# Patient Record
Sex: Male | Born: 1955 | Hispanic: Yes | Marital: Married | Smoking: Never smoker
Health system: Southern US, Community
[De-identification: ages and names within clinical notes are randomized; demographics above are authoritative.]

## PROBLEM LIST (undated history)

## (undated) DIAGNOSIS — I10 Essential (primary) hypertension: Secondary | ICD-10-CM

## (undated) DIAGNOSIS — K5790 Diverticulosis of intestine, part unspecified, without perforation or abscess without bleeding: Secondary | ICD-10-CM

## (undated) DIAGNOSIS — E119 Type 2 diabetes mellitus without complications: Secondary | ICD-10-CM

## (undated) DIAGNOSIS — K219 Gastro-esophageal reflux disease without esophagitis: Secondary | ICD-10-CM

## (undated) DIAGNOSIS — E785 Hyperlipidemia, unspecified: Secondary | ICD-10-CM

## (undated) DIAGNOSIS — G4730 Sleep apnea, unspecified: Secondary | ICD-10-CM

## (undated) DIAGNOSIS — J349 Unspecified disorder of nose and nasal sinuses: Secondary | ICD-10-CM

---

## 2012-01-26 ENCOUNTER — Emergency Department: Payer: Self-pay | Admitting: Emergency Medicine

## 2012-01-26 LAB — COMPREHENSIVE METABOLIC PANEL
Albumin: 3.9 g/dL (ref 3.4–5.0)
Alkaline Phosphatase: 89 U/L (ref 50–136)
Anion Gap: 10 (ref 7–16)
BUN: 14 mg/dL (ref 7–18)
Bilirubin,Total: 0.3 mg/dL (ref 0.2–1.0)
Chloride: 103 mmol/L (ref 98–107)
Creatinine: 0.85 mg/dL (ref 0.60–1.30)
EGFR (African American): >60
Glucose: 102 mg/dL — ABNORMAL HIGH (ref 65–99)
Potassium: 3.8 mmol/L (ref 3.5–5.1)
SGOT(AST): 32 U/L (ref 15–37)
SGPT (ALT): 51 U/L
Total Protein: 8.3 g/dL — ABNORMAL HIGH (ref 6.4–8.2)

## 2012-01-26 LAB — CBC
HCT: 43.9 % (ref 40.0–52.0)
MCH: 30.7 pg (ref 26.0–34.0)
MCHC: 33.7 g/dL (ref 32.0–36.0)
MCV: 91 fL (ref 80–100)
Platelet: 197 10*3/uL (ref 150–440)
RBC: 4.83 10*6/uL (ref 4.40–5.90)
RDW: 13.2 % (ref 11.5–14.5)

## 2012-01-26 LAB — CK TOTAL AND CKMB (NOT AT ARMC)
CK, Total: 152 U/L (ref 35–232)
CK-MB: 1.1 ng/mL (ref 0.5–3.6)

## 2012-01-30 ENCOUNTER — Emergency Department: Payer: Self-pay | Admitting: Unknown Physician Specialty

## 2012-01-31 LAB — TROPONIN I: Troponin-I: <0.02 ng/mL

## 2014-07-02 ENCOUNTER — Emergency Department: Payer: Self-pay | Admitting: Emergency Medicine

## 2016-01-02 ENCOUNTER — Emergency Department
Admission: EM | Admit: 2016-01-02 | Discharge: 2016-01-02 | Disposition: A | Discharge status: Home / Self Care | Payer: BLUE CROSS/BLUE SHIELD | Attending: Emergency Medicine | Admitting: Emergency Medicine

## 2016-01-02 ENCOUNTER — Encounter: Payer: Self-pay | Admitting: Emergency Medicine

## 2016-01-02 ENCOUNTER — Emergency Department: Payer: BLUE CROSS/BLUE SHIELD

## 2016-01-02 DIAGNOSIS — Y998 Other external cause status: Secondary | ICD-10-CM | POA: Diagnosis not present

## 2016-01-02 DIAGNOSIS — I10 Essential (primary) hypertension: Secondary | ICD-10-CM | POA: Diagnosis not present

## 2016-01-02 DIAGNOSIS — X503XXA Overexertion from repetitive movements, initial encounter: Secondary | ICD-10-CM | POA: Diagnosis not present

## 2016-01-02 DIAGNOSIS — S4991XA Unspecified injury of right shoulder and upper arm, initial encounter: Secondary | ICD-10-CM | POA: Diagnosis present

## 2016-01-02 DIAGNOSIS — Y9289 Other specified places as the place of occurrence of the external cause: Secondary | ICD-10-CM | POA: Insufficient documentation

## 2016-01-02 DIAGNOSIS — Y9389 Activity, other specified: Secondary | ICD-10-CM | POA: Insufficient documentation

## 2016-01-02 DIAGNOSIS — M7080 Other soft tissue disorders related to use, overuse and pressure of unspecified site: Secondary | ICD-10-CM

## 2016-01-02 DIAGNOSIS — G5601 Carpal tunnel syndrome, right upper limb: Secondary | ICD-10-CM | POA: Diagnosis not present

## 2016-01-02 DIAGNOSIS — T148XXA Other injury of unspecified body region, initial encounter: Secondary | ICD-10-CM

## 2016-01-02 MED ORDER — METHYLPREDNISOLONE 4 MG PO TBPK: ORAL_TABLET | ORAL | Status: DC

## 2016-01-02 MED ORDER — TRAMADOL HCL 50 MG PO TABS: 50.0000 mg | ORAL_TABLET | Freq: Four times a day (QID) | ORAL | Status: DC | PRN

## 2016-01-02 MED ORDER — DEXAMETHASONE SODIUM PHOSPHATE 10 MG/ML IJ SOLN
10.0000 mg | Freq: Once | INTRAMUSCULAR | Status: AC
  Administered 2016-01-02: 10 mg via INTRAMUSCULAR
  Filled 2016-01-02: qty 1

## 2016-01-02 MED ORDER — TRAMADOL HCL 50 MG PO TABS
50.0000 mg | ORAL_TABLET | Freq: Once | ORAL | Status: AC
  Administered 2016-01-02: 50 mg via ORAL
  Filled 2016-01-02: qty 1

## 2016-01-02 NOTE — ED Notes (Signed)
Pt to ed with c/o right arm and hand pain x 2 months.

## 2016-01-02 NOTE — ED Provider Notes (Signed)
[facility] Emergency Department Provider Note  ____________________________________________  Time seen: Approximately 12:09 PM  I have reviewed the triage vital signs and the nursing notes.   HISTORY  Chief Complaint Arm Pain  Via interpreter  HPI Ryan Hull is a 60 y.o. male patient complain right upper extremity pain for 2 months. Onset of pain was in the right hand and wrist. Patient stated he started experiencing pain with intermittent numbness to the right hand. Patient went to family doctor and was told he had carpal tunnel. Patient was told to take ibuprofen twice a day. Patient state there was some mild relief but started developing stomach pain from taking ibuprofen. He believes his history of diverticulitis take ibuprofen regularly was causing increased stomach pain. Patient stated after discontinuing ibuprofen for 1 week he started noticing pain in the right shoulder and increasing numbness from the shoulder to the hands. Patient has a job history repetitive use of the right upper extremity. Patient is rating his pain as a 7/10.   History reviewed. No pertinent past medical history.  There are no active problems to display for this patient.   History reviewed. No pertinent past surgical history.  No current outpatient prescriptions on file.  Allergies Enalapril  No family history on file.  Social History Social History  Substance Use Topics  . Smoking status: Never Smoker   . Smokeless tobacco: None  . Alcohol Use: Yes    Review of Systems Constitutional: No fever/chills Eyes: No visual changes. ENT: No sore throat. Cardiovascular: Denies chest pain. Respiratory: Denies shortness of breath. Gastrointestinal: No abdominal pain.  No nausea, no vomiting.  No diarrhea.  No constipation. Genitourinary: Negative for dysuria. Musculoskeletal right upper extremity pain. Skin: Negative for rash. Neurological: Negative for headaches, focal  weakness or numbness. Numbness right upper extremity Endocrine:Hypertension 10-point ROS otherwise negative.  ____________________________________________   PHYSICAL EXAM:  VITAL SIGNS: ED Triage Vitals  Enc Vitals Group     BP 01/02/16 1137 154/90 mmHg     Pulse Rate 01/02/16 1137 81     Resp 01/02/16 1137 20     Temp 01/02/16 1137 98.5 F (36.9 C)     Temp Source 01/02/16 1137 Oral     SpO2 01/02/16 1137 99 %     Weight 01/02/16 1140 188 lb (85.276 kg)     Height 01/02/16 1140 5\' 6"  (1.676 m)     Head Cir --      Peak Flow --      Pain Score 01/02/16 1141 7     Pain Loc --      Pain Edu? --      Excl. in GC? --     Constitutional: Alert and oriented. Well appearing and in no acute distress. Eyes: Conjunctivae are normal. PERRL. EOMI. Head: Atraumatic. Nose: No congestion/rhinnorhea. Mouth/Throat: Mucous membranes are moist.  Oropharynx non-erythematous. Neck: No stridor.  No cervical spine tenderness to palpation. Hematological/Lymphatic/Immunilogical: No cervical lymphadenopathy. Cardiovascular: Normal rate, regular rhythm. Grossly normal heart sounds.  Good peripheral circulation. Respiratory: Normal respiratory effort.  No retractions. Lungs CTAB. Gastrointestinal: Soft and nontender. No distention. No abdominal bruits. No CVA tenderness. Musculoskeletal: No obvious deformities to the right upper extremity. Full and equal range of motion. Neurologic:  Normal speech and language. No gross focal neurologic deficits are appreciated. No gait instability. Positive Tinel's sign  Skin:  Skin is warm, dry and intact. No rash noted. Psychiatric: Mood and affect are normal. Speech and behavior are normal.  ____________________________________________   LABS (all labs ordered are listed, but only abnormal results are displayed)  Labs Reviewed - No data to  display ____________________________________________  EKG   ____________________________________________  RADIOLOGY  No acute findings on x-ray. ____________________________________________   PROCEDURES  Procedure(s) performed: None  Critical Care performed: No  ____________________________________________   INITIAL IMPRESSION / ASSESSMENT AND PLAN / ED COURSE  Pertinent labs & imaging results that were available during my care of the patient were reviewed by me and considered in my medical decision making (see chart for details).  Repetitive motion injury to right shoulder. Carpal tunnel syndrome right wrist. Patient given discharge instructions and advised to follow-up with orthopedic clinic for further evaluation and treatment. Patient given a prescription for Medrol Dosepak and tramadol. ____________________________________________   FINAL CLINICAL IMPRESSION(S) / ED DIAGNOSES  Final diagnoses:  Repetitive motion injury  Carpal tunnel syndrome of right wrist      Joni Reining, PA-C 01/02/16 1345  Joni Reining, PA-C 01/02/16 1350  Jeanmarie Plant, MD 01/02/16 1409

## 2016-01-02 NOTE — ED Notes (Signed)
Right arm and hand pain for about 2 months   Unsure of injury  No deformity noted

## 2016-02-07 ENCOUNTER — Other Ambulatory Visit: Payer: Self-pay | Admitting: Student

## 2016-02-07 DIAGNOSIS — M4726 Other spondylosis with radiculopathy, lumbar region: Secondary | ICD-10-CM

## 2016-02-08 ENCOUNTER — Encounter
Admission: RE | Admit: 2016-02-08 | Discharge: 2016-02-08 | Disposition: A | Discharge status: Home / Self Care | Payer: BLUE CROSS/BLUE SHIELD | Source: Ambulatory Visit | Attending: Surgery | Admitting: Surgery

## 2016-02-08 DIAGNOSIS — Z01810 Encounter for preprocedural cardiovascular examination: Secondary | ICD-10-CM | POA: Insufficient documentation

## 2016-02-08 DIAGNOSIS — I10 Essential (primary) hypertension: Secondary | ICD-10-CM | POA: Diagnosis not present

## 2016-02-08 HISTORY — DX: Essential (primary) hypertension: I10

## 2016-02-08 HISTORY — DX: Gastro-esophageal reflux disease without esophagitis: K21.9

## 2016-02-08 HISTORY — DX: Diverticulosis of intestine, part unspecified, without perforation or abscess without bleeding: K57.90

## 2016-02-08 HISTORY — DX: Hyperlipidemia, unspecified: E78.5

## 2016-02-08 HISTORY — DX: Unspecified disorder of nose and nasal sinuses: J34.9

## 2016-02-08 HISTORY — DX: Sleep apnea, unspecified: G47.30

## 2016-02-08 HISTORY — DX: Type 2 diabetes mellitus without complications: E11.9

## 2016-02-08 NOTE — Pre-Procedure Instructions (Signed)
Spanish interpreter, Duwayne Heck, present.

## 2016-02-08 NOTE — Patient Instructions (Addendum)
  Your procedure is scheduled on: 02/13/16 Tues  Report to Day Surgery.2nd floor medical mall To find out your arrival time please call [phone] between 1PM - 3PM on 02/12/16.  Remember: Instructions that are not followed completely may result in serious medical risk, up to and including death, or upon the discretion of your surgeon and anesthesiologist your surgery may need to be rescheduled.    _x__ 1. Do not eat food or drink liquids after midnight. No gum chewing or hard candies.     ____ 2. No Alcohol for 24 hours before or after surgery.   ____ 3. Bring all medications with you on the day of surgery if instructed.    x____ 4. Notify your doctor if there is any change in your medical condition     (cold, fever, infections).     Do not wear jewelry, make-up, hairpins, clips or nail polish.  Do not wear lotions, powders, or perfumes. You may wear deodorant.  Do not shave 48 hours prior to surgery. Men may shave face and neck.  Do not bring valuables to the hospital.    [facility] is not responsible for any belongings or valuables.               Contacts, dentures or bridgework may not be worn into surgery.  Leave your suitcase in the car. After surgery it may be brought to your room.  For patients admitted to the hospital, discharge time is determined by your                treatment team.   Patients discharged the day of surgery will not be allowed to drive home.   Please read over the following fact sheets that you were given:      __x__ Take these medicines the morning of surgery with A SIP OF WATER:    1. prilosec  2. chlorthalidone  3.   4.  5.  6.  ____ Fleet Enema (as directed)   __x__ Use CHG Soap as directed  ____ Use inhalers on the day of surgery  ____ Stop metformin 2 days prior to surgery    ____ Take 1/2 of usual insulin dose the night before surgery and none on the morning of surgery.   __x__ Stop Coumadin/Plavix/aspirin on stop aspirin  now  _x_ Stop Anti-inflammatories today stop ibuprofen and aspirin may take tylenol as needed   ____ Stop supplements until after surgery.    ____ Bring C-Pap to the hospital.

## 2016-02-13 ENCOUNTER — Encounter
Admission: RE | Disposition: A | Discharge status: Home / Self Care | Payer: Self-pay | Source: Ambulatory Visit | Attending: Surgery

## 2016-02-13 ENCOUNTER — Encounter: Payer: Self-pay | Admitting: *Deleted

## 2016-02-13 ENCOUNTER — Ambulatory Visit
Admission: RE | Admit: 2016-02-13 | Discharge: 2016-02-13 | Disposition: A | Discharge status: Home / Self Care | Payer: BLUE CROSS/BLUE SHIELD | Source: Ambulatory Visit | Attending: Surgery | Admitting: Surgery

## 2016-02-13 DIAGNOSIS — Z5309 Procedure and treatment not carried out because of other contraindication: Secondary | ICD-10-CM | POA: Diagnosis not present

## 2016-02-13 DIAGNOSIS — G5601 Carpal tunnel syndrome, right upper limb: Secondary | ICD-10-CM | POA: Diagnosis present

## 2016-02-13 LAB — GLUCOSE, CAPILLARY: Glucose-Capillary: 136 mg/dL — ABNORMAL HIGH (ref 65–99)

## 2016-02-13 SURGERY — RELEASE, CARPAL TUNNEL, ENDOSCOPIC
Anesthesia: Choice | Laterality: Right

## 2016-02-13 MED ORDER — SODIUM CHLORIDE 0.9 % IV SOLN: INTRAVENOUS | Status: DC

## 2016-02-13 MED ORDER — BUPIVACAINE-EPINEPHRINE (PF) 0.5% -1:200000 IJ SOLN
INTRAMUSCULAR | Status: AC
  Filled 2016-02-13: qty 30

## 2016-02-13 MED ORDER — CEFAZOLIN SODIUM-DEXTROSE 2-3 GM-% IV SOLR
INTRAVENOUS | Status: AC
  Filled 2016-02-13: qty 50

## 2016-02-13 MED ORDER — CEFAZOLIN SODIUM-DEXTROSE 2-3 GM-% IV SOLR: 2.0000 g | Freq: Once | INTRAVENOUS | Status: DC

## 2016-02-13 SURGICAL SUPPLY — 38 items
BLADE SURG 15 STRL LF DISP TIS (BLADE) IMPLANT
BLADE SURG 15 STRL SS (BLADE)
BNDG COHESIVE 4X5 TAN STRL (GAUZE/BANDAGES/DRESSINGS) IMPLANT
BNDG ELASTIC 2X5.8 VLCR STR LF (GAUZE/BANDAGES/DRESSINGS) IMPLANT
BNDG ESMARK 4X12 TAN STRL LF (GAUZE/BANDAGES/DRESSINGS) IMPLANT
BNDG GAUZE 4.5X4.1 6PLY STRL (MISCELLANEOUS) IMPLANT
CANISTER SUCT 1200ML W/VALVE (MISCELLANEOUS) IMPLANT
CHLORAPREP W/TINT 26ML (MISCELLANEOUS) IMPLANT
CORD BIP STRL DISP 12FT (MISCELLANEOUS) IMPLANT
DRAPE IMP U-DRAPE 54X76 (DRAPES) IMPLANT
DRAPE SURG 17X11 SM STRL (DRAPES) IMPLANT
GAUZE PETRO XEROFOAM 1X8 (MISCELLANEOUS) IMPLANT
GAUZE SPONGE 4X4 12PLY STRL (GAUZE/BANDAGES/DRESSINGS) IMPLANT
GLOVE BIO SURGEON STRL SZ8 (GLOVE) IMPLANT
GLOVE BIOGEL M 7.0 STRL (GLOVE) IMPLANT
GLOVE BIOGEL PI IND STRL 7.5 (GLOVE) IMPLANT
GLOVE BIOGEL PI INDICATOR 7.5 (GLOVE)
GLOVE INDICATOR 8.0 STRL GRN (GLOVE) IMPLANT
GOWN STRL REUS W/ TWL LRG LVL3 (GOWN DISPOSABLE) IMPLANT
GOWN STRL REUS W/ TWL XL LVL3 (GOWN DISPOSABLE) IMPLANT
GOWN STRL REUS W/TWL LRG LVL3 (GOWN DISPOSABLE)
GOWN STRL REUS W/TWL XL LVL3 (GOWN DISPOSABLE)
KIT CARPAL TUNNEL (MISCELLANEOUS)
KIT ESCP INSRT D SLOT CANN KN (MISCELLANEOUS) IMPLANT
KIT RM TURNOVER STRD PROC AR (KITS) IMPLANT
NDL SAFETY 25GX1.5 (NEEDLE) IMPLANT
NS IRRIG 500ML POUR BTL (IV SOLUTION) IMPLANT
PACK EXTREMITY ARMC (MISCELLANEOUS) IMPLANT
PAD CAST CTTN 4X4 STRL (SOFTGOODS) IMPLANT
PADDING CAST COTTON 4X4 STRL (SOFTGOODS)
SPLINT WRIST LG LT TX990309 (SOFTGOODS) IMPLANT
SPLINT WRIST M LT TX990308 (SOFTGOODS) IMPLANT
SPLINT WRIST M RT TX990303 (SOFTGOODS) IMPLANT
STOCKINETTE M/LG 89821 (MISCELLANEOUS) IMPLANT
STOCKINETTE STRL 4IN 9604848 (GAUZE/BANDAGES/DRESSINGS) IMPLANT
SUT PROLENE 4 0 PS 2 18 (SUTURE) IMPLANT
SYR 30ML LL (SYRINGE) IMPLANT
SYRINGE 10CC LL (SYRINGE) IMPLANT

## 2016-02-13 NOTE — Progress Notes (Signed)
Patient arrived to [facility], patient stated he had a beer this morning, ate breakfast and lunch. Anesthesia cancelled surgery. Made patient aware that he needs to be NPO after midnight prior when his surgery is rescheduled and advised to call office in the next couple of days to reschedule if office does not contact prior. Patient left ambulatory.

## 2016-02-20 ENCOUNTER — Encounter: Payer: Self-pay | Admitting: Anesthesiology

## 2016-02-20 ENCOUNTER — Ambulatory Visit
Admission: RE | Admit: 2016-02-20 | Discharge: 2016-02-20 | Disposition: A | Discharge status: Home / Self Care | Payer: BLUE CROSS/BLUE SHIELD | Source: Ambulatory Visit | Attending: Surgery | Admitting: Surgery

## 2016-02-20 ENCOUNTER — Ambulatory Visit: Payer: BLUE CROSS/BLUE SHIELD | Admitting: Anesthesiology

## 2016-02-20 ENCOUNTER — Encounter
Admission: RE | Disposition: A | Discharge status: Home / Self Care | Payer: Self-pay | Source: Ambulatory Visit | Attending: Surgery

## 2016-02-20 DIAGNOSIS — Z79899 Other long term (current) drug therapy: Secondary | ICD-10-CM | POA: Insufficient documentation

## 2016-02-20 DIAGNOSIS — I10 Essential (primary) hypertension: Secondary | ICD-10-CM | POA: Diagnosis not present

## 2016-02-20 DIAGNOSIS — M5416 Radiculopathy, lumbar region: Secondary | ICD-10-CM | POA: Insufficient documentation

## 2016-02-20 DIAGNOSIS — G4730 Sleep apnea, unspecified: Secondary | ICD-10-CM | POA: Insufficient documentation

## 2016-02-20 DIAGNOSIS — Z888 Allergy status to other drugs, medicaments and biological substances status: Secondary | ICD-10-CM | POA: Insufficient documentation

## 2016-02-20 DIAGNOSIS — E785 Hyperlipidemia, unspecified: Secondary | ICD-10-CM | POA: Insufficient documentation

## 2016-02-20 DIAGNOSIS — Z7982 Long term (current) use of aspirin: Secondary | ICD-10-CM | POA: Diagnosis not present

## 2016-02-20 DIAGNOSIS — Z7951 Long term (current) use of inhaled steroids: Secondary | ICD-10-CM | POA: Insufficient documentation

## 2016-02-20 DIAGNOSIS — Z8261 Family history of arthritis: Secondary | ICD-10-CM | POA: Diagnosis not present

## 2016-02-20 DIAGNOSIS — M479 Spondylosis, unspecified: Secondary | ICD-10-CM | POA: Diagnosis not present

## 2016-02-20 DIAGNOSIS — Z8349 Family history of other endocrine, nutritional and metabolic diseases: Secondary | ICD-10-CM | POA: Diagnosis not present

## 2016-02-20 DIAGNOSIS — G5601 Carpal tunnel syndrome, right upper limb: Secondary | ICD-10-CM | POA: Diagnosis not present

## 2016-02-20 DIAGNOSIS — Z833 Family history of diabetes mellitus: Secondary | ICD-10-CM | POA: Diagnosis not present

## 2016-02-20 DIAGNOSIS — Z8249 Family history of ischemic heart disease and other diseases of the circulatory system: Secondary | ICD-10-CM | POA: Insufficient documentation

## 2016-02-20 DIAGNOSIS — Z8379 Family history of other diseases of the digestive system: Secondary | ICD-10-CM | POA: Insufficient documentation

## 2016-02-20 HISTORY — PX: CARPAL TUNNEL RELEASE: SHX101

## 2016-02-20 LAB — GLUCOSE, CAPILLARY
Glucose-Capillary: 106 mg/dL — ABNORMAL HIGH (ref 65–99)
Glucose-Capillary: 127 mg/dL — ABNORMAL HIGH (ref 65–99)

## 2016-02-20 SURGERY — RELEASE, CARPAL TUNNEL, ENDOSCOPIC
Anesthesia: General | Site: Hand | Laterality: Right | Wound class: Clean

## 2016-02-20 MED ORDER — PROPOFOL 10 MG/ML IV BOLUS
INTRAVENOUS | Status: DC | PRN
  Administered 2016-02-20: 170 mg via INTRAVENOUS

## 2016-02-20 MED ORDER — SODIUM CHLORIDE 0.9 % IV SOLN
INTRAVENOUS | Status: DC
  Administered 2016-02-20: 07:00:00 via INTRAVENOUS

## 2016-02-20 MED ORDER — CEFAZOLIN SODIUM-DEXTROSE 2-3 GM-% IV SOLR
INTRAVENOUS | Status: AC
  Administered 2016-02-20: 2 g via INTRAVENOUS
  Filled 2016-02-20: qty 50

## 2016-02-20 MED ORDER — NEOSTIGMINE METHYLSULFATE 10 MG/10ML IV SOLN
INTRAVENOUS | Status: DC | PRN
  Administered 2016-02-20: 4 mg via INTRAVENOUS

## 2016-02-20 MED ORDER — FAMOTIDINE 20 MG PO TABS
ORAL_TABLET | ORAL | Status: AC
  Filled 2016-02-20: qty 1

## 2016-02-20 MED ORDER — BUPIVACAINE-EPINEPHRINE (PF) 0.5% -1:200000 IJ SOLN
INTRAMUSCULAR | Status: DC | PRN
  Administered 2016-02-20: 10 mL via PERINEURAL

## 2016-02-20 MED ORDER — METOCLOPRAMIDE HCL 5 MG/ML IJ SOLN
5.0000 mg | Freq: Three times a day (TID) | INTRAMUSCULAR | Status: DC | PRN
Start: 2016-02-20 — End: 2016-02-20

## 2016-02-20 MED ORDER — ONDANSETRON HCL 4 MG/2ML IJ SOLN: 4.0000 mg | Freq: Once | INTRAMUSCULAR | Status: DC | PRN

## 2016-02-20 MED ORDER — ONDANSETRON HCL 4 MG/2ML IJ SOLN: 4.0000 mg | Freq: Four times a day (QID) | INTRAMUSCULAR | Status: DC | PRN

## 2016-02-20 MED ORDER — FENTANYL CITRATE (PF) 100 MCG/2ML IJ SOLN: 25.0000 ug | INTRAMUSCULAR | Status: DC | PRN

## 2016-02-20 MED ORDER — BUPIVACAINE HCL (PF) 0.5 % IJ SOLN
INTRAMUSCULAR | Status: AC
  Filled 2016-02-20: qty 30

## 2016-02-20 MED ORDER — FENTANYL CITRATE (PF) 100 MCG/2ML IJ SOLN
INTRAMUSCULAR | Status: DC | PRN
  Administered 2016-02-20 (×2): 50 ug via INTRAVENOUS

## 2016-02-20 MED ORDER — SUCCINYLCHOLINE CHLORIDE 20 MG/ML IJ SOLN
INTRAMUSCULAR | Status: DC | PRN
  Administered 2016-02-20: 100 mg via INTRAVENOUS

## 2016-02-20 MED ORDER — HYDROCODONE-ACETAMINOPHEN 5-325 MG PO TABS: 1.0000 | ORAL_TABLET | ORAL | Status: DC | PRN

## 2016-02-20 MED ORDER — GLYCOPYRROLATE 0.2 MG/ML IJ SOLN
INTRAMUSCULAR | Status: DC | PRN
  Administered 2016-02-20: 0.6 mg via INTRAVENOUS

## 2016-02-20 MED ORDER — LIDOCAINE HCL (CARDIAC) 20 MG/ML IV SOLN
INTRAVENOUS | Status: DC | PRN
  Administered 2016-02-20: 100 mg via INTRAVENOUS

## 2016-02-20 MED ORDER — ROCURONIUM BROMIDE 100 MG/10ML IV SOLN
INTRAVENOUS | Status: DC | PRN
  Administered 2016-02-20: 5 mg via INTRAVENOUS
  Administered 2016-02-20: 10 mg via INTRAVENOUS

## 2016-02-20 MED ORDER — ONDANSETRON HCL 4 MG/2ML IJ SOLN
INTRAMUSCULAR | Status: DC | PRN
  Administered 2016-02-20: 4 mg via INTRAVENOUS

## 2016-02-20 MED ORDER — MIDAZOLAM HCL 2 MG/2ML IJ SOLN
INTRAMUSCULAR | Status: DC | PRN
  Administered 2016-02-20: 2 mg via INTRAVENOUS

## 2016-02-20 MED ORDER — METOCLOPRAMIDE HCL 10 MG PO TABS
5.0000 mg | ORAL_TABLET | Freq: Three times a day (TID) | ORAL | Status: DC | PRN
Start: 2016-02-20 — End: 2016-02-20

## 2016-02-20 MED ORDER — FAMOTIDINE 20 MG PO TABS
20.0000 mg | ORAL_TABLET | Freq: Once | ORAL | Status: AC
  Administered 2016-02-20: 20 mg via ORAL

## 2016-02-20 MED ORDER — DEXAMETHASONE SODIUM PHOSPHATE 10 MG/ML IJ SOLN
INTRAMUSCULAR | Status: DC | PRN
  Administered 2016-02-20: 5 mg via INTRAVENOUS

## 2016-02-20 MED ORDER — ONDANSETRON HCL 4 MG PO TABS: 4.0000 mg | ORAL_TABLET | Freq: Four times a day (QID) | ORAL | Status: DC | PRN

## 2016-02-20 MED ORDER — HYDROCODONE-ACETAMINOPHEN 5-325 MG PO TABS: 1.0000 | ORAL_TABLET | Freq: Four times a day (QID) | ORAL | Status: AC | PRN

## 2016-02-20 MED ORDER — POTASSIUM CHLORIDE IN NACL 20-0.9 MEQ/L-% IV SOLN: INTRAVENOUS | Status: DC

## 2016-02-20 MED ORDER — LACTATED RINGERS IV SOLN
INTRAVENOUS | Status: DC | PRN
  Administered 2016-02-20: 07:00:00 via INTRAVENOUS

## 2016-02-20 MED ORDER — CEFAZOLIN SODIUM-DEXTROSE 2-3 GM-% IV SOLR: 2.0000 g | Freq: Once | INTRAVENOUS | Status: DC

## 2016-02-20 SURGICAL SUPPLY — 39 items
BLADE SURG 15 STRL LF DISP TIS (BLADE) ×1 IMPLANT
BLADE SURG 15 STRL SS (BLADE) ×2
BNDG COHESIVE 4X5 TAN STRL (GAUZE/BANDAGES/DRESSINGS) ×3 IMPLANT
BNDG ELASTIC 2X5.8 VLCR STR LF (GAUZE/BANDAGES/DRESSINGS) ×3 IMPLANT
BNDG ESMARK 4X12 TAN STRL LF (GAUZE/BANDAGES/DRESSINGS) ×3 IMPLANT
BNDG GAUZE 4.5X4.1 6PLY STRL (MISCELLANEOUS) IMPLANT
CANISTER SUCT 1200ML W/VALVE (MISCELLANEOUS) ×3 IMPLANT
CHLORAPREP W/TINT 26ML (MISCELLANEOUS) ×3 IMPLANT
CORD BIP STRL DISP 12FT (MISCELLANEOUS) ×3 IMPLANT
DRAPE IMP U-DRAPE 54X76 (DRAPES) ×3 IMPLANT
DRAPE SURG 17X11 SM STRL (DRAPES) ×3 IMPLANT
GAUZE PETRO XEROFOAM 1X8 (MISCELLANEOUS) ×3 IMPLANT
GAUZE SPONGE 4X4 12PLY STRL (GAUZE/BANDAGES/DRESSINGS) ×3 IMPLANT
GLOVE BIO SURGEON STRL SZ8 (GLOVE) ×6 IMPLANT
GLOVE BIOGEL M 7.0 STRL (GLOVE) ×6 IMPLANT
GLOVE BIOGEL PI IND STRL 7.5 (GLOVE) ×1 IMPLANT
GLOVE BIOGEL PI INDICATOR 7.5 (GLOVE) ×2
GLOVE INDICATOR 8.0 STRL GRN (GLOVE) ×3 IMPLANT
GOWN STRL REUS W/ TWL LRG LVL3 (GOWN DISPOSABLE) ×1 IMPLANT
GOWN STRL REUS W/ TWL XL LVL3 (GOWN DISPOSABLE) ×2 IMPLANT
GOWN STRL REUS W/TWL LRG LVL3 (GOWN DISPOSABLE) ×2
GOWN STRL REUS W/TWL XL LVL3 (GOWN DISPOSABLE) ×4
KIT CARPAL TUNNEL (MISCELLANEOUS) ×2
KIT ESCP INSRT D SLOT CANN KN (MISCELLANEOUS) ×1 IMPLANT
KIT RM TURNOVER STRD PROC AR (KITS) ×3 IMPLANT
NDL SAFETY 25GX1.5 (NEEDLE) ×3 IMPLANT
NS IRRIG 500ML POUR BTL (IV SOLUTION) ×3 IMPLANT
PACK EXTREMITY ARMC (MISCELLANEOUS) ×3 IMPLANT
PAD CAST CTTN 4X4 STRL (SOFTGOODS) IMPLANT
PADDING CAST COTTON 4X4 STRL (SOFTGOODS)
SPLINT WRIST LG LT TX990309 (SOFTGOODS) IMPLANT
SPLINT WRIST LG RT TX900304 (SOFTGOODS) ×3 IMPLANT
SPLINT WRIST M LT TX990308 (SOFTGOODS) IMPLANT
SPLINT WRIST M RT TX990303 (SOFTGOODS) IMPLANT
STOCKINETTE M/LG 89821 (MISCELLANEOUS) ×3 IMPLANT
STOCKINETTE STRL 4IN 9604848 (GAUZE/BANDAGES/DRESSINGS) ×3 IMPLANT
SUT PROLENE 4 0 PS 2 18 (SUTURE) ×3 IMPLANT
SYR 30ML LL (SYRINGE) ×3 IMPLANT
SYRINGE 10CC LL (SYRINGE) ×3 IMPLANT

## 2016-02-20 NOTE — Transfer of Care (Signed)
Immediate Anesthesia Transfer of Care Note  Patient: Ryan Hull  Procedure(s) Performed: Procedure(s): CARPAL TUNNEL RELEASE ENDOSCOPIC (Right)  Patient Location: PACU  Anesthesia Type:General  Level of Consciousness: awake, alert  and oriented  Airway & Oxygen Therapy: Patient Spontanous Breathing  Post-op Assessment: Report given to RN and Post -op Vital signs reviewed and stable  Post vital signs: Reviewed and stable  Last Vitals:  Filed Vitals:   02/20/16 0619 02/20/16 0825  BP: 146/92 160/89  Pulse: 70 71  Temp: 36.5 C 36.1 C  Resp:  12    Complications: No apparent anesthesia complications

## 2016-02-20 NOTE — Op Note (Signed)
02/20/2016  11:25 AM  Pre-Op Diagnosis:   Right carpal tunnel syndrome.  Post-Op Diagnosis:   Same.  Procedure:   Endoscopic right carpal tunnel release.  Surgeon:   Maryagnes Amos, MD  Anesthesia:   General endotracheal  Findings:   As above.  Complications:   None  EBL:   <1 cc  Fluids:   500 cc crystalloid  TT:   16 minutes at 250 mmHg  Drains:   None  Closure:   4-0 Prolene interrupted sutures  Brief Clinical Note:   The patient is a 60 year old male with a long history of gradually worsening pain and paresthesias of his right hand. His symptoms have persisted despite medications, activity modification, splinting, etc. His history and examination are consistent with carpal tunnel syndrome confirmed by EMG. The patient presents at this time for an endoscopic right carpal tunnel release.   Procedure:   The patient was brought into the operating room and lain in the supine position. After adequate general endotracheal intubation and anesthesia was achieved, the right hand and upper extremity were prepped with ChloraPrep solution before being draped sterilely. Preoperative antibiotics were administered. A timeout was performed to verify the appropriate surgical site before the limb was exsanguinated with an Esmarch and the tourniquet inflated to 250 mmHg. An approximately 1.5-2 cm incision was made over the volar wrist flexion crease, centered over the palmaris longus tendon. The incision was carried down through the subcutaneous tissues with care taken to identify and protect any neurovascular structures. The distal forearm fascia was penetrated just proximal to the transverse carpal ligament. The soft tissues were released off the superficial and deep surfaces of the distal forearm fascia and this was released proximally for 3-4 cm under direct visualization.  Attention was directed distally. The Therapist, nutritional was passed beneath the transverse carpal ligament along the ulnar  aspect of the carpal tunnel and used to release any adhesions as well as to remove any adherent synovial tissue before first the smaller then the larger of the two dilators were passed beneath the transverse carpal ligament along the ulnar margin of the carpal tunnel. The slotted cannula was introduced and the endoscope was placed into the slotted cannula and the undersurface of the transverse carpal ligament visualized. The distal margin of the transverse carpal ligament was marked by placing a 25-gauge needle percutaneously at Kaplan's cardinal point so that it entered the distal portion of the slotted cannula. Under endoscopic visualization, the transverse carpal ligament was released from proximal to distal using the end-cutting blade. A second pass was performed to ensure complete release of the ligament. The adequacy of release was verified both endoscopically and by palpation using the freer elevator.  The wound was irrigated thoroughly with sterile saline solution before being closed using 4-0 Prolene interrupted sutures. A total of 10 cc of 0.5% plain Sensorcaine was injected in and around the incision before a sterile bulky dressing was applied to the wound. The patient was placed into a volar wrist splint before being awakened and returned to the recovery room in satisfactory condition after tolerating the procedure well.

## 2016-02-20 NOTE — Discharge Instructions (Addendum)
Keep dressing dry and intact. Keep hand elevated above heart level. May shower after dressing removed on postop day 4 (Saturday). Cover sutures with Band-Aids after drying off, then reapply splint. Apply ice to affected area frequently. Return for follow-up in 10-14 days or as scheduled.  Anestesia general - Adultos (General Anesthesia, Adult) La anestesia general produce un estado similar al sueo en el que no hay sensibilidad, por medio de la administracin de medicamentos (anestsicos). Impide estar alerta y sentir dolor durante un procedimiento mdico. El mdico indicar anestesia general si el procedimiento:   Va a Teacher, music.  Es doloroso o [employer].  Sera atemorizante para ver o escuchar.  Requiere que [employer].  Afecta la respiracin.  Puede causar una prdida significativa de [place]. INFORME A SU MDICO SOBRE:   Alergias a alimentos o medicamentos.  Medicamentos que [place], incluyendo vitaminas, hierbas, gotas oftlmicas, medicamentos de venta libre y cremas.  Uso de corticoides (por va oral o cremas).  Problemas anteriores con anestsicos o medicamentos para adormecer, incluyendo los problemas experimentados por familiares.  Antecedentes de hemorragias o cogulos sanguneos.  Cirugas previas y tipos de anestsicos recibidos.  Posibilidad de embarazo, si corresponde.  Consumo de cigarrillos, alcohol o drogas.  Otros problemas de salud, especialmente si sufre diabetes, apnea del sueo o hipertensin arterial. RIESGOS Y COMPLICACIONES  La anestesia general rara vez causa complicaciones. Sin embargo, si hay complicaciones, pueden poner en peligro la vida. Las complicaciones son:   Infeccin pulmonar.  Ictus.  Ataque cardaco.  Despertar durante el procedimiento. Cuando esto ocurre, el paciente puede ser incapaz de moverse y Audiological scientist que est despierto. Podr sentir dolor intenso. Los [employer] y los que tengan problemas mdicos  graves son ms propensos a sufrir [employer] adultos jvenes y sanos. Algunas de las complicaciones se pueden prevenir respondiendo a todas las preguntas del mdico y siguiendo todas las instrucciones previas al procedimiento. Es importante que informe a su mdico si no ha seguido [place] de las instrucciones previas al procedimiento, especialmente las relacionadas con la dieta. Cualquier alimento o lquido en el estmago pueden causar problemas cuando se recibe anestesia general.  ANTES DEL PROCEDIMIENTO   Pregntele a su mdico si tendr que pasar la noche en el hospital. Si no pasar la noche hospitalizado, haga arreglos con un adulto para que lo lleve de vuelta a su casa y se quede con usted durante 24 horas.  Siga las instrucciones de su mdico si usted est tomando suplementos dietarios o medicamentos. El mdico le dir cundo debe dejar de tomarlos o reducir la dosis.  No fume durante el mayor tiempo posible antes del procedimiento. Si puede, deje de fumar 3-6 semanas antes.  No tome nuevos suplementos dietticos ni medicamentos dentro de 1 semana del procedimiento, excepto que su mdico lo apruebe.  No coma dentro de las 8 horas de su procedimiento o segn las indicaciones de su mdico. Beba slo lquidos claros, como agua, caf negro (sin [place] o crema), y jugos de frutas (sin pulpa).  No beba dentro de las 3 horas del procedimiento o segn las indicaciones de su mdico.  Puede lavarse los dientes en la maana del procedimiento, pero asegrese de escupir la pasta de dientes y el agua cuando haya terminado. PROCEDIMIENTO  Recibir la anestesia a travs de [place]line [place], a travs de una va intravenosa (IV) o ambas. Un mdico que se especializa en anestesia (anestesilogo) o una enfermera especializada (enfermera especializada en anestesiologa) o ambos, permanecern con usted durante todo  el procedimiento para asegurarse de que est dormido. Tambin le controlarn la presin  arterial, el pulso y los niveles de oxgeno para asegurarse de que no tenga ningn problema. Una vez que est dormido, Musician un tubo o una mscara para ayudarlo a Industrial/product designer.  DESPUS DEL PROCEDIMIENTO  Lo despertarn cuando el procedimiento se haya completado. Permanecer en la sala donde se realiz el procedimiento o en un rea de recuperacin. Usted puede tener dolor de garganta si le colocaron un respirador. Tambin puede sentir:   [employer].  Debilidad.  Somnolencia.  Confusin.  Nuseas.  Fro. Estas respuestas son normales y pueden durar hasta 24 horas despus del procedimiento. El mdico le dir cundo estar listo para volver a Pensions consultant. En general, ser cuando est completamente despierto y estable.    Esta informacin no tiene Theme park manager el consejo del mdico. Asegrese de hacerle al mdico cualquier pregunta que tenga.   Document Released: 04/10/2011 Document Revised: 12/30/2014 Elsevier Interactive Patient Education 2016 [employer].   Liberacin del tnel carpiano, cuidados posteriores (Carpal Tunnel Release, Care After) Siga estas instrucciones durante las prximas semanas. Estas indicaciones le proporcionan informacin acerca de cmo deber cuidarse despus del procedimiento. El mdico tambin podr darle instrucciones ms especficas. El tratamiento ha sido planificado segn las prcticas mdicas actuales, pero en algunos casos pueden ocurrir problemas. Comunquese con el mdico si tiene algn problema o tiene dudas despus del procedimiento. QU ESPERAR DESPUS DEL PROCEDIMIENTO Despus del procedimiento, es normal tener lo siguiente:  Dolor.  Entumecimiento.  Hormigueo.  Hinchazn.  Entumecimiento.  [place]. INSTRUCCIONES PARA EL CUIDADO EN EL HOGAR  Tome los medicamentos solamente como se lo haya indicado el mdico.  Hay muchas maneras distintas de cerrar y Leonia Reeves incisin, entre ellas, puntos (suturas), pegamento cutneo y tiras  [place]. Siga las instrucciones del mdico con respecto a lo siguiente:  Financial planner herida.  Cambiar y Oceanographer el vendaje.  Quitar el cierre de la incisin.  Usar una frula o un dispositivo ortopdico como se lo haya indicado el cirujano. Tal vez esto se prolongue durante 2 o 3semanas.  Mientras est descansando, mantenga la mano en alto (elevada) por encima del nivel del corazn. Mueva los dedos con frecuencia.  Evite las actividades que causan dolor de la [place].  Consltele al cirujano cundo puede reanudar todas sus actividades habituales, por ejemplo:  Conducir.  Volver a Printmaker.  Baarse y Education administrator natacin.  Concurra a todas las visitas de control como se lo haya indicado el mdico. Esto es importante. Tal vez necesite fisioterapia durante varios meses para acelerar la cicatrizacin y Buyer, retail. SOLICITE ATENCIN MDICA SI:  Hay secrecin, enrojecimiento, hinchazn o dolor en el lugar de la incisin.  Tiene fiebre.  Tiene escalofros.  El medicamento para Primary school teacher no le hace efecto.  Los sntomas no desaparecen despus de .  Los sntomas desaparecen y Stage manager. SOLICITE ATENCIN MDICA DE INMEDIATO SI:  Siente dolor o adormecimiento que empeora.  Los dedos [place] de color.  No puede mover los dedos.   Esta informacin no tiene Theme park manager el consejo del mdico. Asegrese de hacerle al mdico cualquier pregunta que tenga.   Document Released: 09/29/2013 Document Revised: 12/30/2014 Elsevier Interactive Patient Education [employer]

## 2016-02-20 NOTE — Anesthesia Preprocedure Evaluation (Signed)
Anesthesia Evaluation  Patient identified by MRN, date of birth, ID band Patient awake    Reviewed: Allergy & Precautions, NPO status , Patient's Chart, lab work & pertinent test results  History of Anesthesia Complications Negative for: history of anesthetic complications  Airway Mallampati: II       Dental   Pulmonary neg pulmonary ROS, sleep apnea ,           Cardiovascular hypertension, Pt. on medications      Neuro/Psych negative neurological ROS     GI/Hepatic negative GI ROS, Neg liver ROS, GERD  Medicated and Poorly Controlled,  Endo/Other  diabetes, Type 2, Oral Hypoglycemic Agents  Renal/GU negative Renal ROS     Musculoskeletal   Abdominal   Peds  Hematology negative hematology ROS (+)   Anesthesia Other Findings   Reproductive/Obstetrics                             Anesthesia Physical Anesthesia Plan  ASA: II  Anesthesia Plan: General   Post-op Pain Management:    Induction: Intravenous  Airway Management Planned: Oral ETT  Additional Equipment:   Intra-op Plan:   Post-operative Plan:   Informed Consent: I have reviewed the patients History and Physical, chart, labs and discussed the procedure including the risks, benefits and alternatives for the proposed anesthesia with the patient or authorized representative who has indicated his/her understanding and acceptance.     Plan Discussed with:   Anesthesia Plan Comments:         Anesthesia Quick Evaluation

## 2016-02-20 NOTE — Anesthesia Postprocedure Evaluation (Signed)
Anesthesia Post Note  Patient: Ryan Hull  Procedure(s) Performed: Procedure(s) (LRB): CARPAL TUNNEL RELEASE ENDOSCOPIC (Right)  Patient location during evaluation: PACU Anesthesia Type: General Level of consciousness: awake and alert Pain management: pain level controlled Vital Signs Assessment: post-procedure vital signs reviewed and stable Respiratory status: spontaneous breathing and respiratory function stable Cardiovascular status: stable Anesthetic complications: no    Last Vitals:  Filed Vitals:   02/20/16 0925 02/20/16 0942  BP: 136/95 152/95  Pulse: 65 67  Temp:    Resp: 16 16    Last Pain:  Filed Vitals:   02/20/16 0943  PainSc: Asleep                 KEPHART,WILLIAM K

## 2016-02-20 NOTE — H&P (Signed)
Paper H&P to be scanned into permanent record. H&P reviewed. No changes. 

## 2016-02-20 NOTE — Anesthesia Procedure Notes (Signed)
Procedure Name: Intubation Date/Time: 02/20/2016 7:44 AM Performed by: Almeta Monas Pre-anesthesia Checklist: Patient identified, Emergency Drugs available, Suction available, Patient being monitored and Timeout performed Patient Re-evaluated:Patient Re-evaluated prior to inductionOxygen Delivery Method: Circle system utilized Preoxygenation: Pre-oxygenation with 100% oxygen Intubation Type: Cricoid Pressure applied, Rapid sequence and IV induction Laryngoscope Size: Mac and 3 Grade View: Grade I Tube type: Oral Tube size: 7.0 mm Number of attempts: 1 Airway Equipment and Method: Stylet and Patient positioned with wedge pillow Placement Confirmation: ETT inserted through vocal cords under direct vision,  positive ETCO2,  CO2 detector and breath sounds checked- equal and bilateral Secured at: 21 cm Tube secured with: Tape Dental Injury: Teeth and Oropharynx as per pre-operative assessment

## 2016-02-27 ENCOUNTER — Other Ambulatory Visit: Payer: BLUE CROSS/BLUE SHIELD

## 2016-05-22 ENCOUNTER — Other Ambulatory Visit: Payer: Self-pay | Admitting: Otolaryngology

## 2016-05-22 DIAGNOSIS — H9042 Sensorineural hearing loss, unilateral, left ear, with unrestricted hearing on the contralateral side: Secondary | ICD-10-CM

## 2016-06-10 ENCOUNTER — Other Ambulatory Visit: Payer: Self-pay | Admitting: Otolaryngology

## 2016-06-10 ENCOUNTER — Ambulatory Visit
Admission: RE | Admit: 2016-06-10 | Discharge: 2016-06-10 | Disposition: A | Discharge status: Home / Self Care | Payer: BLUE CROSS/BLUE SHIELD | Source: Ambulatory Visit | Attending: Otolaryngology | Admitting: Otolaryngology

## 2016-06-10 DIAGNOSIS — G930 Cerebral cysts: Secondary | ICD-10-CM | POA: Diagnosis not present

## 2016-06-10 DIAGNOSIS — J3489 Other specified disorders of nose and nasal sinuses: Secondary | ICD-10-CM | POA: Insufficient documentation

## 2016-06-10 DIAGNOSIS — H9042 Sensorineural hearing loss, unilateral, left ear, with unrestricted hearing on the contralateral side: Secondary | ICD-10-CM

## 2016-06-10 DIAGNOSIS — H9192 Unspecified hearing loss, left ear: Secondary | ICD-10-CM | POA: Diagnosis not present

## 2017-10-31 IMAGING — MR MR HEAD W/O CM
10 of 13 series · 37 of 48 positions shown · non-contrast
Comparison: None.

CLINICAL DATA: 59-year-old hypertensive diabetic male with elevated
lipids presenting with bilateral hearing loss greater on the left
over the past year. Initial encounter.

EXAM:
MRI HEAD WITHOUT CONTRAST
TECHNIQUE: Multiplanar, multiecho pulse sequences of the brain and surrounding
structures were obtained without intravenous contrast.

[Series 2: T1 · sagittal · 5.0mm · 0.45mm/px · 3 of 30 slices shown (1 of 2)]
[im 1/30]
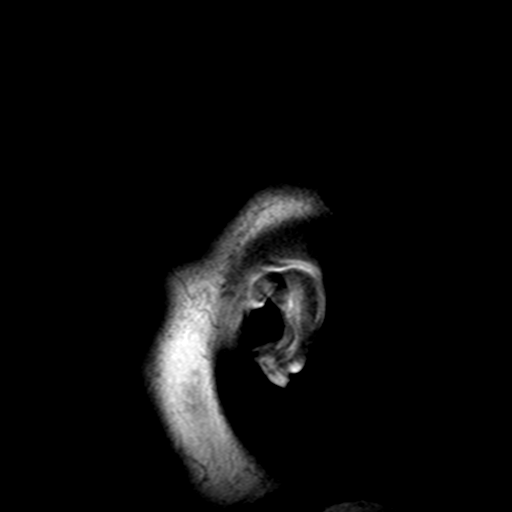
[im 15/30]
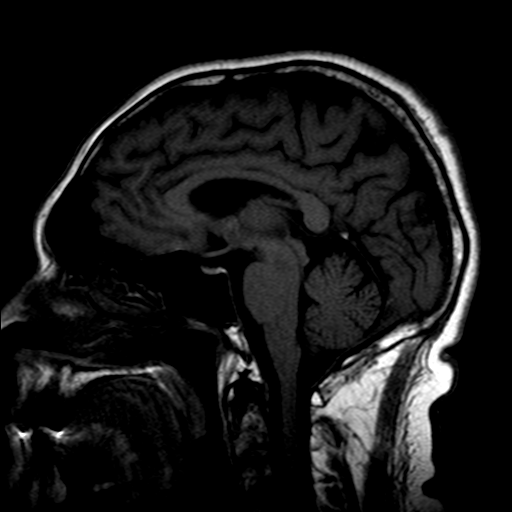
[im 30/30]
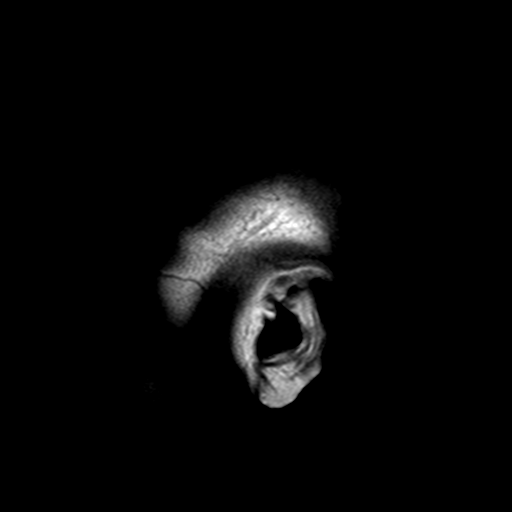

[Series 4: DWI · axial · 4.0mm · 0.94mm/px · z∈[-56,+114]mm · 5 of 44 slices shown (1 of 4)]
[im 1/44]
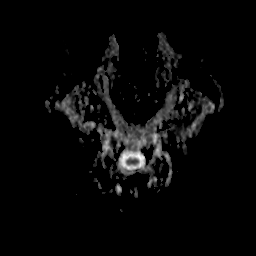
[im 11/44]
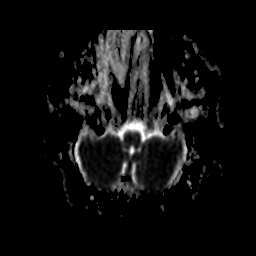
[im 22/44]
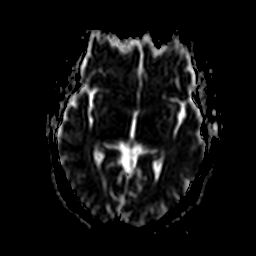
[im 33/44]
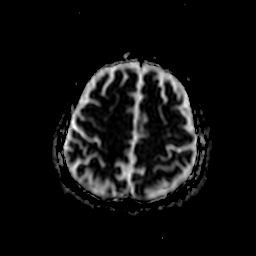
[im 44/44]
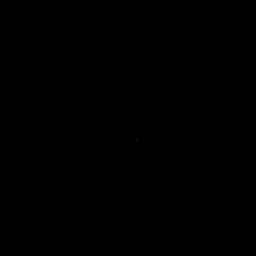

[Series 6: DWI · coronal · 5.0mm · 1.80mm/px · 4 of 39 slices shown (2 of 4)]
[im 1/39]
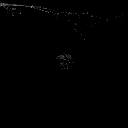
[im 13/39]
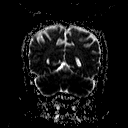
[im 26/39]
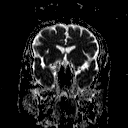
[im 39/39]
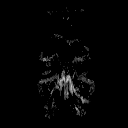

[Series 7: T2 · axial · 5.0mm · 0.45mm/px · z∈[-57,+110]mm · 3 of 27 slices shown (1 of 3)]
[im 1/27]
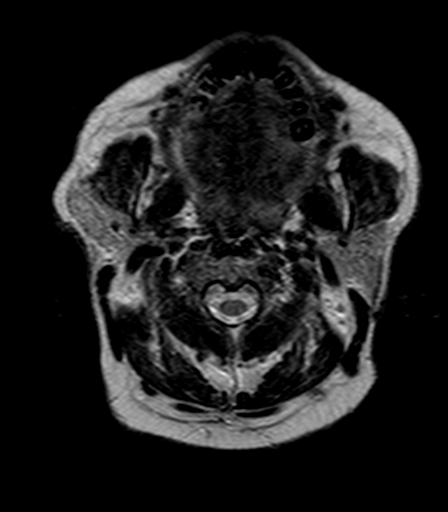
[im 14/27]
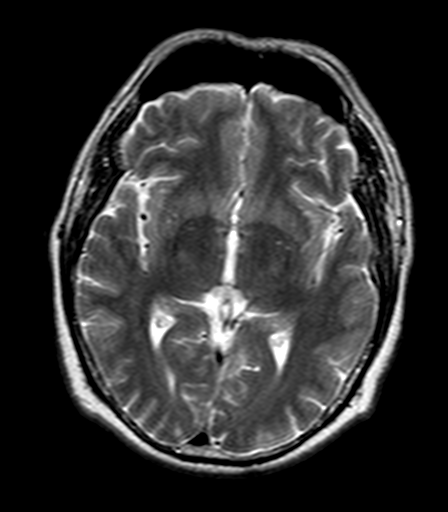
[im 27/27]
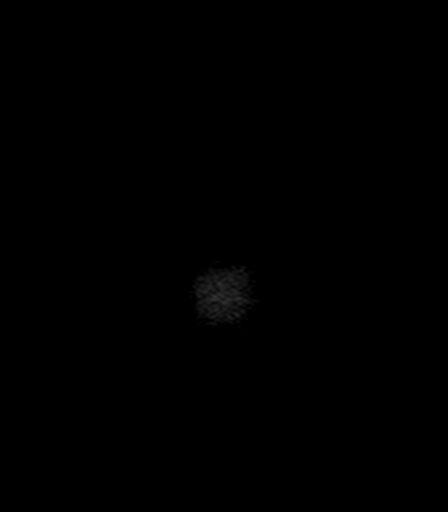

[Series 8: FLAIR · axial · 5.0mm · 0.90mm/px · z∈[-58,+110]mm · 3 of 27 slices shown]
[im 1/27]
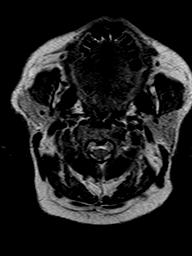
[im 14/27]
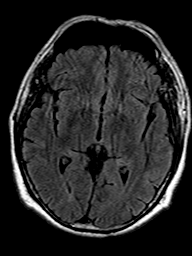
[im 27/27]
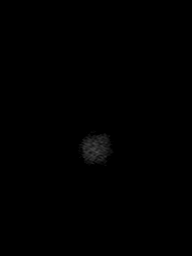

[Series 9: T2 · axial · 5.0mm · 0.45mm/px · z∈[-57,+110]mm · 3 of 27 slices shown (2 of 3)]
[im 1/27]
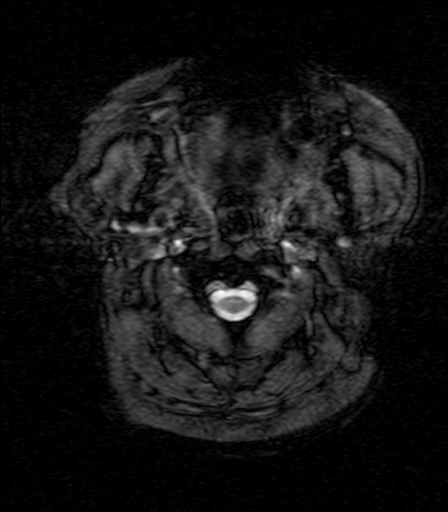
[im 14/27]
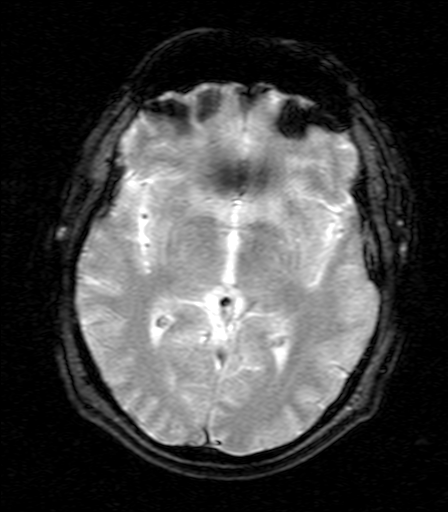
[im 27/27]
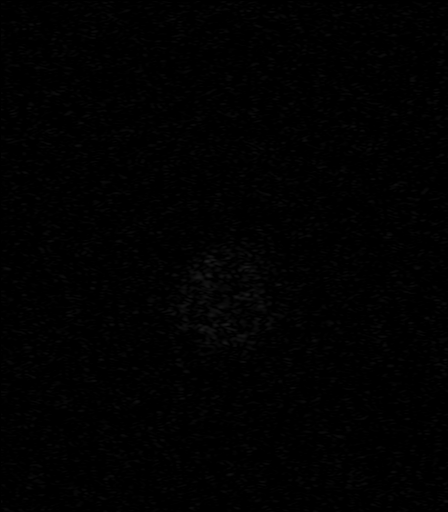

[Series 10: T1 · axial · 3.0mm · 0.45mm/px · z∈[-61,+25]mm · 4 of 60 slices shown (2 of 2)]
[im 1/60]
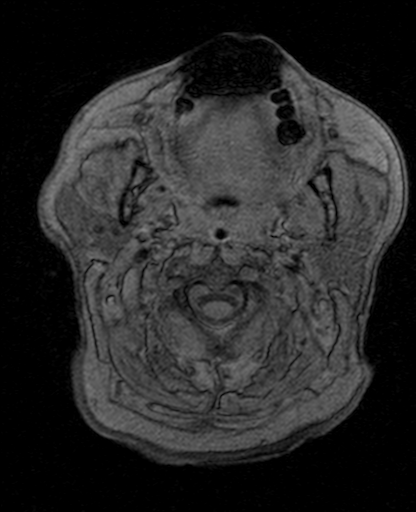
[im 10/60]
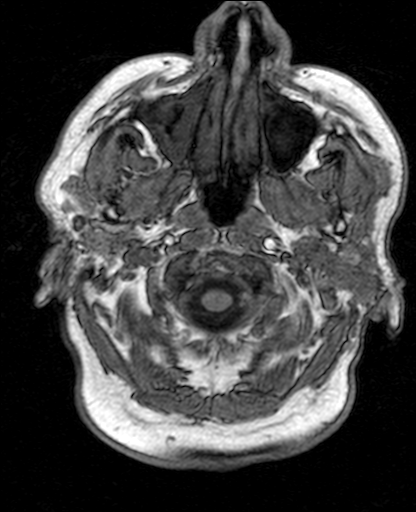
[im 20/60]
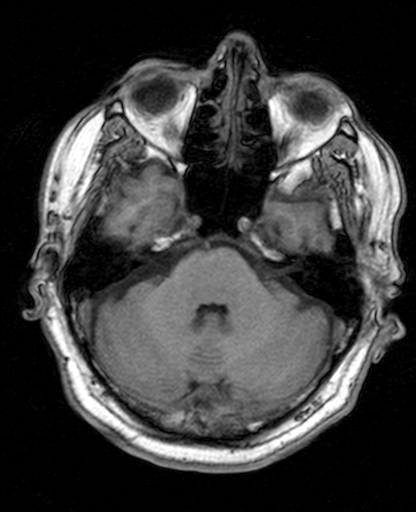
[im 30/60]
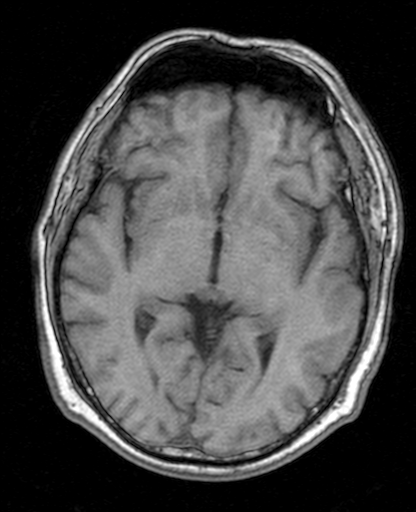

[Series 11: T2 · coronal · 5.0mm · 0.45mm/px · 3 of 29 slices shown (3 of 3)]
[im 1/29]
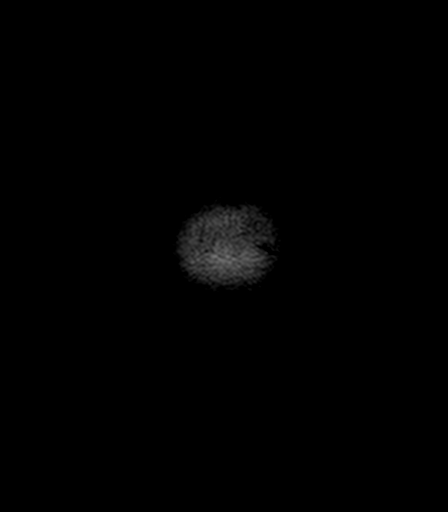
[im 15/29]
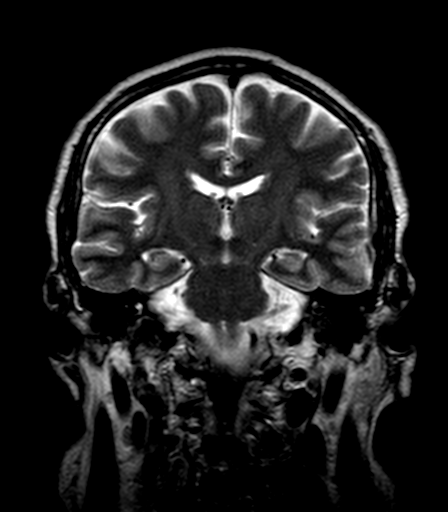
[im 29/29]
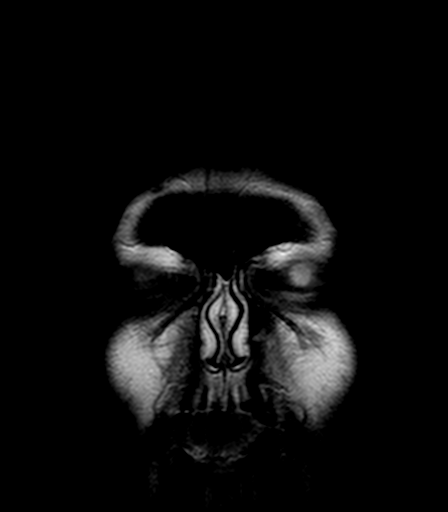

[Series 14: DWI · axial · 4.0mm · 0.94mm/px · z∈[-56,+110]mm · 5 of 42 slices shown (3 of 4)]
[im 1/42]
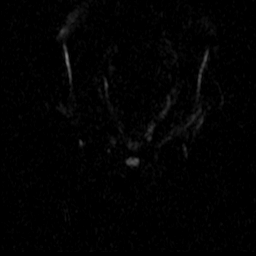
[im 11/42]
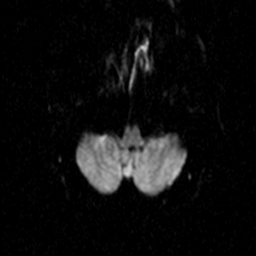
[im 21/42]
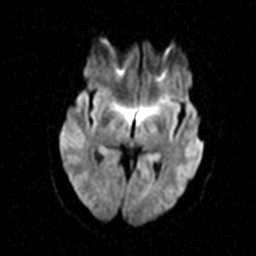
[im 31/42]
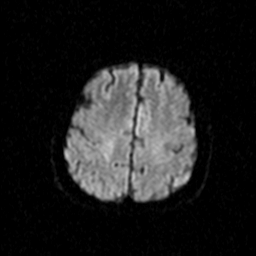
[im 42/42]
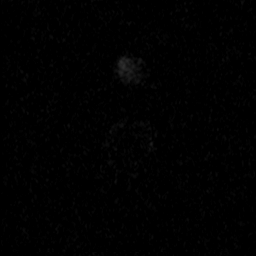

[Series 15: DWI · coronal · 5.0mm · 1.80mm/px · 4 of 38 slices shown (4 of 4)]
[im 1/38]
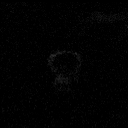
[im 13/38]
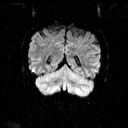
[im 25/38]
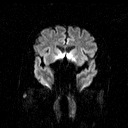
[im 38/38]
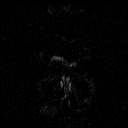

[37 of 48 positions shown; findings below may reference images not displayed]

FINDINGS: Patient refused contrast (claustrophobia). This limits evaluation
for detection of subtle internal auditory canal abnormality. On the
thin section T2 weighted imaging through the petrous temporal bones,
labyrinthine structures are noted to be symmetric and normal
bilaterally without internal auditory canal mass noted.

No acute infarct or intracranial hemorrhage.

No age advanced atrophy hydrocephalus.

Small anterior left middle cranial fossa arachnoid cyst. Otherwise
no intracranial mass lesion noted on this unenhanced slightly motion
degraded exam.

Slightly heterogeneous bone marrow upper cervical spine may be
related patient's habitus. No focal osseous abnormality noted.

Shallow sella with slightly small pituitary gland. Cervical
medullary junction unremarkable.

Small right vertebral artery probably ends in a posterior inferior
cerebellar artery distribution. Major intracranial vascular
structures are patent.

Moderate mucosal thickening right maxillary sinus. Small air-fluid
level may be present indicating possible acute on top of chronic
sinusitis. Moderate polypoid opacification inferior left maxillary
sinus. Minimal mucosal thickening ethmoid sinus air cells
bilaterally.

No worrisome orbital abnormality.
IMPRESSION: Patient refused contrast (claustrophobia). This limits evaluation
for detection of subtle internal auditory canal abnormality. On the
thin section T2 weighted imaging through the petrous temporal bones,
labyrinthine structures are noted to be symmetric and normal
bilaterally without internal auditory canal mass noted.

Small anterior left middle cranial fossa arachnoid cyst.

Moderate mucosal thickening right maxillary sinus. Small air-fluid
level may be present indicating acute on top of chronic sinusitis.
Moderate polypoid opacification inferior left maxillary sinus.
Minimal mucosal thickening ethmoid sinus air cells bilaterally.

## 2018-03-05 ENCOUNTER — Encounter: Payer: Self-pay | Admitting: Emergency Medicine

## 2018-03-05 ENCOUNTER — Other Ambulatory Visit: Payer: Self-pay

## 2018-03-05 ENCOUNTER — Emergency Department
Admission: EM | Admit: 2018-03-05 | Discharge: 2018-03-05 | Disposition: A | Discharge status: Home / Self Care | Payer: BLUE CROSS/BLUE SHIELD | Attending: Emergency Medicine | Admitting: Emergency Medicine

## 2018-03-05 ENCOUNTER — Emergency Department: Payer: BLUE CROSS/BLUE SHIELD

## 2018-03-05 DIAGNOSIS — M545 Low back pain: Secondary | ICD-10-CM | POA: Insufficient documentation

## 2018-03-05 DIAGNOSIS — M542 Cervicalgia: Secondary | ICD-10-CM | POA: Diagnosis not present

## 2018-03-05 DIAGNOSIS — Z7982 Long term (current) use of aspirin: Secondary | ICD-10-CM | POA: Insufficient documentation

## 2018-03-05 DIAGNOSIS — E119 Type 2 diabetes mellitus without complications: Secondary | ICD-10-CM | POA: Diagnosis not present

## 2018-03-05 DIAGNOSIS — I10 Essential (primary) hypertension: Secondary | ICD-10-CM | POA: Diagnosis not present

## 2018-03-05 DIAGNOSIS — Z79899 Other long term (current) drug therapy: Secondary | ICD-10-CM | POA: Diagnosis not present

## 2018-03-05 MED ORDER — ETODOLAC 400 MG PO TABS: 400.0000 mg | ORAL_TABLET | Freq: Two times a day (BID) | ORAL | 0 refills | Status: AC

## 2018-03-05 NOTE — Discharge Instructions (Signed)
[facility] if any continued problems.  You may take etodolac twice a day with food for muscle pain.  Use ice or heat to your neck and back as needed for discomfort. Your regular medication.  Also have your blood pressure rechecked by your primary care provider.
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
## 2020-03-12 ENCOUNTER — Ambulatory Visit: Payer: BLUE CROSS/BLUE SHIELD | Attending: Internal Medicine

## 2020-03-12 DIAGNOSIS — Z23 Encounter for immunization: Secondary | ICD-10-CM

## 2020-03-12 NOTE — Progress Notes (Signed)
   Covid-19 Vaccination Clinic  Name:  [name]    MRN: [id number] DOB: Nov 04, 1956  03/12/2020  [name] was observed post Covid-19 immunization for 15 minutes without incident. He was provided with Vaccine Information Sheet and instruction to access the V-Safe system.   [name] was instructed to call 911 with any severe reactions post vaccine: [name] Kitchen Difficulty breathing  . Swelling of face and throat  . A fast heartbeat  . A bad rash all over body  . Dizziness and weakness   Immunizations Administered    Name Date Dose VIS Date Route   Pfizer COVID-19 Vaccine 03/12/2020 10:09 AM 0.3 mL 12/03/2019 Intramuscular   Manufacturer: [employer], [employer]   Lot: [id number]   NDC: [address]-1

## 2020-04-02 ENCOUNTER — Ambulatory Visit: Payer: BLUE CROSS/BLUE SHIELD | Attending: Internal Medicine

## 2020-04-02 DIAGNOSIS — Z23 Encounter for immunization: Secondary | ICD-10-CM

## 2020-04-02 NOTE — Progress Notes (Signed)
   Covid-19 Vaccination Clinic  Name:  Ryan Hull    MRN: [id number] DOB: Jul 20, 1956  04/02/2020  [name] was observed post Covid-19 immunization for 15 minutes without incident. He was provided with Vaccine Information Sheet and instruction to access the V-Safe system.   [name] was instructed to call 911 with any severe reactions post vaccine: [name] Kitchen Difficulty breathing  . Swelling of face and throat  . A fast heartbeat  . A bad rash all over body  . Dizziness and weakness   Immunizations Administered    Name Date Dose VIS Date Route   Pfizer COVID-19 Vaccine 04/02/2020  9:39 AM 0.3 mL 12/03/2019 Intramuscular   Manufacturer: [employer], [employer]   Lot: [id number]   NDC: [address]-7

## 2021-08-11 ENCOUNTER — Other Ambulatory Visit: Payer: Self-pay

## 2021-08-11 DIAGNOSIS — Z5321 Procedure and treatment not carried out due to patient leaving prior to being seen by health care provider: Secondary | ICD-10-CM | POA: Diagnosis not present

## 2021-08-11 DIAGNOSIS — M79604 Pain in right leg: Secondary | ICD-10-CM | POA: Diagnosis present

## 2021-08-11 NOTE — ED Triage Notes (Signed)
Pt states he hurt left leg pulling something two weeks ago. Pt states the pain radiates from left hip into left leg. Pain is worse with ambulation. Pt tried taking ibuprofen this morning without relief.

## 2021-08-12 ENCOUNTER — Emergency Department
Admission: EM | Admit: 2021-08-12 | Discharge: 2021-08-12 | Disposition: A | Discharge status: Home / Self Care | Payer: BLUE CROSS/BLUE SHIELD | Attending: Emergency Medicine | Admitting: Emergency Medicine

## 2021-08-12 NOTE — ED Notes (Signed)
Called for repeat VS x 2, no answer.

## 2023-04-30 ENCOUNTER — Other Ambulatory Visit: Payer: Self-pay | Admitting: Physician Assistant

## 2023-04-30 DIAGNOSIS — Z87891 Personal history of nicotine dependence: Secondary | ICD-10-CM

## 2023-06-23 ENCOUNTER — Ambulatory Visit
Admission: RE | Admit: 2023-06-23 | Discharge: 2023-06-23 | Disposition: A | Discharge status: Home / Self Care | Payer: Medicare Other | Source: Ambulatory Visit | Attending: Physician Assistant | Admitting: Physician Assistant

## 2023-06-23 DIAGNOSIS — Z87891 Personal history of nicotine dependence: Secondary | ICD-10-CM | POA: Insufficient documentation

## 2023-06-23 DIAGNOSIS — Z136 Encounter for screening for cardiovascular disorders: Secondary | ICD-10-CM | POA: Diagnosis not present

## 2023-07-02 ENCOUNTER — Encounter: Payer: Self-pay | Admitting: *Deleted

## 2023-07-17 ENCOUNTER — Telehealth: Payer: Self-pay

## 2023-07-17 NOTE — Telephone Encounter (Signed)
Pt call to make appt for husband ( colonoscopy) I didn't see her name on DPR. Pt ask if someone could talk to her. I explain that the husband need to fill out a DPR in order for [place] to talk with her.

## 2023-07-18 NOTE — Telephone Encounter (Signed)
Message left for patient to return my call.  

## 2023-07-21 NOTE — Telephone Encounter (Signed)
Since wife is not on DPR, I tried to call the home number again, VM is full this time. I just need for patient to tell it is okay to talk his wife.
